# Patient Record
Sex: Female | Born: 1985
Health system: Southern US, Community
[De-identification: ages and names within clinical notes are randomized; demographics above are authoritative.]

---
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
## 2006-01-21 ENCOUNTER — Other Ambulatory Visit: Admission: RE | Admit: 2006-01-21 | Discharge: 2006-01-21 | Payer: Self-pay | Admitting: Family Medicine

## 2006-08-30 ENCOUNTER — Other Ambulatory Visit: Admission: RE | Admit: 2006-08-30 | Discharge: 2006-08-30 | Payer: Self-pay | Admitting: Obstetrics and Gynecology

## 2014-08-14 LAB — OB RESULTS CONSOLE ABO/RH: RH TYPE: POSITIVE

## 2014-08-14 LAB — OB RESULTS CONSOLE RUBELLA ANTIBODY, IGM: Rubella: IMMUNE

## 2014-08-14 LAB — OB RESULTS CONSOLE HIV ANTIBODY (ROUTINE TESTING): HIV: NONREACTIVE

## 2014-08-14 LAB — OB RESULTS CONSOLE GC/CHLAMYDIA
CHLAMYDIA, DNA PROBE: NEGATIVE
GC PROBE AMP, GENITAL: NEGATIVE

## 2014-08-14 LAB — OB RESULTS CONSOLE RPR: RPR: NONREACTIVE

## 2014-08-14 LAB — OB RESULTS CONSOLE GBS: GBS: NEGATIVE

## 2015-01-26 NOTE — L&D Delivery Note (Signed)
  Delivery Note I was called to attend this delivery d/t rapid progression. After one push, a viable female was born at 0026, apgars 9/9, weight pending.  After 3 minutes, the cord was clamped and cut. 10 units of pitocin was given IM. At 0029, the placenta separated spontaneously and delivered via CCT and maternal pushing effort.  It was inspected and appears to be intact with a 3 VC.    No anesthesia No tears EBL: 50cc   Mom to postpartum.  Baby to Couplet care / Skin to Skin.  Sandoval,Barbara Endres 02/28/2015, 12:42 AM

## 2015-02-27 ENCOUNTER — Inpatient Hospital Stay (HOSPITAL_COMMUNITY)
Admission: AD | Admit: 2015-02-27 | Discharge: 2015-03-01 | DRG: 775 | Disposition: A | Discharge status: Home / Self Care | Payer: BLUE CROSS/BLUE SHIELD | Source: Ambulatory Visit | Attending: Obstetrics and Gynecology | Admitting: Obstetrics and Gynecology

## 2015-02-27 DIAGNOSIS — Z3490 Encounter for supervision of normal pregnancy, unspecified, unspecified trimester: Secondary | ICD-10-CM

## 2015-02-27 DIAGNOSIS — Z3A39 39 weeks gestation of pregnancy: Secondary | ICD-10-CM

## 2015-02-27 NOTE — MAU Note (Signed)
PT  SAYS HURT BAD  AT 10PM.     VE IN OFFICE  1  CM,      DENIES HSV AND MRSA.    GBS-  NEG

## 2015-02-28 ENCOUNTER — Encounter (HOSPITAL_COMMUNITY): Payer: Self-pay | Admitting: *Deleted

## 2015-02-28 DIAGNOSIS — Z3A39 39 weeks gestation of pregnancy: Secondary | ICD-10-CM

## 2015-02-28 DIAGNOSIS — Z3490 Encounter for supervision of normal pregnancy, unspecified, unspecified trimester: Secondary | ICD-10-CM

## 2015-02-28 LAB — CBC
HCT: 35.2 % — ABNORMAL LOW (ref 36.0–46.0)
HEMATOCRIT: 37.2 % (ref 36.0–46.0)
HEMOGLOBIN: 11.9 g/dL — AB (ref 12.0–15.0)
Hemoglobin: 12.9 g/dL (ref 12.0–15.0)
MCH: 30.2 pg (ref 26.0–34.0)
MCH: 30.8 pg (ref 26.0–34.0)
MCHC: 33.8 g/dL (ref 30.0–36.0)
MCHC: 34.7 g/dL (ref 30.0–36.0)
MCV: 88.8 fL (ref 78.0–100.0)
MCV: 89.3 fL (ref 78.0–100.0)
PLATELETS: 276 10*3/uL (ref 150–400)
Platelets: 250 10*3/uL (ref 150–400)
RBC: 3.94 MIL/uL (ref 3.87–5.11)
RBC: 4.19 MIL/uL (ref 3.87–5.11)
RDW: 15 % (ref 11.5–15.5)
RDW: 15 % (ref 11.5–15.5)
WBC: 17.4 10*3/uL — ABNORMAL HIGH (ref 4.0–10.5)
WBC: 21.6 10*3/uL — ABNORMAL HIGH (ref 4.0–10.5)

## 2015-02-28 LAB — TYPE AND SCREEN
ABO/RH(D): O POS
Antibody Screen: NEGATIVE

## 2015-02-28 LAB — ABO/RH: ABO/RH(D): O POS

## 2015-02-28 LAB — RPR: RPR Ser Ql: NONREACTIVE

## 2015-02-28 MED ORDER — SIMETHICONE 80 MG PO CHEW: 80.0000 mg | CHEWABLE_TABLET | ORAL | Status: DC | PRN

## 2015-02-28 MED ORDER — LACTATED RINGERS IV SOLN: 500.0000 mL | INTRAVENOUS | Status: DC | PRN

## 2015-02-28 MED ORDER — OXYTOCIN 10 UNIT/ML IJ SOLN: 2.5000 [IU]/h | INTRAVENOUS | Status: DC

## 2015-02-28 MED ORDER — OXYTOCIN BOLUS FROM INFUSION: 500.0000 mL | INTRAVENOUS | Status: DC

## 2015-02-28 MED ORDER — LIDOCAINE HCL (PF) 1 % IJ SOLN
30.0000 mL | INTRAMUSCULAR | Status: DC | PRN
  Filled 2015-02-28: qty 30

## 2015-02-28 MED ORDER — ONDANSETRON HCL 4 MG/2ML IJ SOLN: 4.0000 mg | INTRAMUSCULAR | Status: DC | PRN

## 2015-02-28 MED ORDER — OXYTOCIN 10 UNIT/ML IJ SOLN
INTRAMUSCULAR | Status: AC
  Administered 2015-02-28: 10 [IU]
  Filled 2015-02-28: qty 1

## 2015-02-28 MED ORDER — ACETAMINOPHEN 325 MG PO TABS: 650.0000 mg | ORAL_TABLET | ORAL | Status: DC | PRN

## 2015-02-28 MED ORDER — TETANUS-DIPHTH-ACELL PERTUSSIS 5-2.5-18.5 LF-MCG/0.5 IM SUSP
0.5000 mL | Freq: Once | INTRAMUSCULAR | Status: AC
  Administered 2015-03-01: 0.5 mL via INTRAMUSCULAR
  Filled 2015-02-28: qty 0.5

## 2015-02-28 MED ORDER — DIBUCAINE 1 % RE OINT: 1.0000 "application " | TOPICAL_OINTMENT | RECTAL | Status: DC | PRN

## 2015-02-28 MED ORDER — WITCH HAZEL-GLYCERIN EX PADS: 1.0000 "application " | MEDICATED_PAD | CUTANEOUS | Status: DC | PRN

## 2015-02-28 MED ORDER — LANOLIN HYDROUS EX OINT: TOPICAL_OINTMENT | CUTANEOUS | Status: DC | PRN

## 2015-02-28 MED ORDER — LIDOCAINE HCL (PF) 1 % IJ SOLN
INTRAMUSCULAR | Status: AC
  Filled 2015-02-28: qty 30

## 2015-02-28 MED ORDER — OXYCODONE-ACETAMINOPHEN 5-325 MG PO TABS: 2.0000 | ORAL_TABLET | ORAL | Status: DC | PRN

## 2015-02-28 MED ORDER — CITRIC ACID-SODIUM CITRATE 334-500 MG/5ML PO SOLN: 30.0000 mL | ORAL | Status: DC | PRN

## 2015-02-28 MED ORDER — IBUPROFEN 600 MG PO TABS
600.0000 mg | ORAL_TABLET | Freq: Four times a day (QID) | ORAL | Status: DC
  Administered 2015-02-28 – 2015-03-01 (×6): 600 mg via ORAL
  Filled 2015-02-28 (×6): qty 1

## 2015-02-28 MED ORDER — OXYCODONE-ACETAMINOPHEN 5-325 MG PO TABS: 1.0000 | ORAL_TABLET | ORAL | Status: DC | PRN

## 2015-02-28 MED ORDER — ONDANSETRON HCL 4 MG/2ML IJ SOLN: 4.0000 mg | Freq: Four times a day (QID) | INTRAMUSCULAR | Status: DC | PRN

## 2015-02-28 MED ORDER — LACTATED RINGERS IV SOLN: INTRAVENOUS | Status: DC

## 2015-02-28 MED ORDER — BENZOCAINE-MENTHOL 20-0.5 % EX AERO
1.0000 "application " | INHALATION_SPRAY | CUTANEOUS | Status: DC | PRN
  Administered 2015-02-28: 1 via TOPICAL
  Filled 2015-02-28: qty 56

## 2015-02-28 MED ORDER — PRENATAL MULTIVITAMIN CH
1.0000 | ORAL_TABLET | Freq: Every day | ORAL | Status: DC
  Administered 2015-02-28 – 2015-03-01 (×2): 1 via ORAL
  Filled 2015-02-28 (×2): qty 1

## 2015-02-28 MED ORDER — DIPHENHYDRAMINE HCL 25 MG PO CAPS: 25.0000 mg | ORAL_CAPSULE | Freq: Four times a day (QID) | ORAL | Status: DC | PRN

## 2015-02-28 MED ORDER — ZOLPIDEM TARTRATE 5 MG PO TABS: 5.0000 mg | ORAL_TABLET | Freq: Every evening | ORAL | Status: DC | PRN

## 2015-02-28 MED ORDER — ONDANSETRON HCL 4 MG PO TABS: 4.0000 mg | ORAL_TABLET | ORAL | Status: DC | PRN

## 2015-02-28 MED ORDER — SENNOSIDES-DOCUSATE SODIUM 8.6-50 MG PO TABS
2.0000 | ORAL_TABLET | ORAL | Status: DC
  Administered 2015-03-01: 2 via ORAL
  Filled 2015-02-28: qty 2

## 2015-02-28 NOTE — H&P (Signed)
APRILLE SAWHNEY is a 30 y.o. female presenting for UCs. Maternal Medical History:  Reason for admission: Contractions.     OB History    Gravida Para Term Preterm AB TAB SAB Ectopic Multiple Living   History reviewed. No pertinent past medical history. History reviewed. No pertinent past surgical history. Family History: family history is not on file. Social History:  has no tobacco, alcohol, and drug history on file.   Prenatal Transfer Tool  Maternal Diabetes: No Genetic Screening: Normal Maternal Ultrasounds/Referrals: Normal Fetal Ultrasounds or other Referrals:  None Maternal Substance Abuse:  No Significant Maternal Medications:  None Significant Maternal Lab Results:  None Other Comments:  None  ROS  Dilation: 4 Effacement (%): 70 Station: 0 Exam by:: BRIGETTE,  RN Height  (1.651 m), weight 199 lb 6.4 oz (90.447 kg). Exam Physical Exam  Cardiovascular: Normal rate.   Respiratory: Effort normal.  GI: Soft.    Prenatal labs: ABO, Rh:   Antibody:   Rubella:   RPR:    HBsAg:    HIV:    GBS:     Assessment/Plan: 30 yo G2P1 @ 39 5/7 weeks in active labor   Kendrea Cerritos II,Shepherd Finnan E 02/28/2015, 12:43 AM

## 2015-02-28 NOTE — Progress Notes (Signed)
I was called with a report that Cx = 7cm and patient pushing involuntarily. I immediately came to hospital. Upon arrival baby/placenta delivered and patient stable. States patient progressed from 7 cm to delivery in one contraction.

## 2015-02-28 NOTE — Lactation Note (Signed)
This note was copied from the chart of Barbara Saraia Platner. Lactation Consultation Note  Initial visit made.  Breastfeeding consultation services and support information given and reviewed with patient.  Mom states baby has been nursing well since birth.  Observed mom latch baby to breast easily.  Baby nursed actively with good swallows. Instructed to feed with any cue.  Encouraged to call with concerns/assist.  Patient Name: Barbara Sandoval Today's Date: 02/28/2015 Reason for consult: Initial assessment   Maternal Data Formula Feeding for Exclusion: No Does the patient have breastfeeding experience prior to this delivery?: Yes  Feeding Feeding Type: Breast Fed  LATCH Score/Interventions Latch: Grasps breast easily, tongue down, lips flanged, rhythmical sucking.  Audible Swallowing: A few with stimulation Intervention(s): Skin to skin;Hand expression;Alternate breast massage  Type of Nipple: Everted at rest and after stimulation  Comfort (Breast/Nipple): Soft / non-tender     Hold (Positioning): No assistance needed to correctly position infant at breast.  LATCH Score: 9  Lactation Tools Discussed/Used     Consult Status Consult Status: Follow-up Date: 03/01/15 Follow-up type: In-patient    Barbara Sandoval 02/28/2015, 2:11 PM

## 2015-02-28 NOTE — MAU Note (Signed)
Pt presented to MAU with painful uc's.

## 2015-02-28 NOTE — Progress Notes (Signed)
Post Partum Day 0 Subjective: no complaints, up ad lib, voiding and tolerating PO  Objective: Blood pressure 110/68, pulse 76, temperature 98.1 F (36.7 C), temperature source Oral, resp. rate 20, height  (1.651 m), weight 199 lb 6.4 oz (90.447 kg), unknown if currently breastfeeding.  Physical Exam:  General: alert and cooperative Lochia: appropriate Uterine Fundus: firm Incision: healing well DVT Evaluation: No evidence of DVT seen on physical exam. Negative Homan's sign. No cords or calf tenderness. No significant calf/ankle edema.   Recent Labs  02/28/15 0020 02/28/15 0605  HGB 12.9 11.9*  HCT 37.2 35.2*    Assessment/Plan: Plan for discharge tomorrow   LOS: 0 days   Jonna Dittrich G 02/28/2015, 8:37 AM

## 2015-03-01 NOTE — Progress Notes (Signed)
CLINICAL SOCIAL WORK MATERNAL/CHILD NOTE  Patient Details  Name: Barbara Sandoval MRN: [id number] Date of Birth: 02/28/2014  Date: 03/01/2015  Clinical Social Worker Initiating Note: Tammie Ellsworth, LCSWDate/ Time Initiated: 03/01/15/1045   Child's Name: Barbara Sandoval   Legal Guardian:  (Parents )   Need for Interpreter: None   Date of Referral: 02/28/15   Reason for Referral:     Referral Source: [facility]   Address: [address] [place], [place] [address]  Phone number:  [phone])   Household Members: Minor Children, Spouse   Natural Supports (not living in the home): Extended Family, Immediate Family   Professional Supports:None   Employment: (Both parents employed)   Type of Work:     Education:     Printmaker   Other Resources:     Cultural/Religious Considerations Which May Impact Care: none noted  Strengths: Ability to meet basic needs , Home prepared for child , Pediatrician chosen    Risk Factors/Current Problems:     Cognitive State: Able to Concentrate , Alert    Mood/Affect: Happy , Interested    CSW Assessment: Acknowledged order for social work consult to assess mother's hx of Depression and anxiety. Met with mother who was pleasant and receptive to social work. She is married with one other dependent age 35. Spouse was present and very attentive. MOB states that her Physician initially thought that she was exhibiting symptoms of depression and anxiety, but later diagnosed her with ADHD. Informed that prior to the pregnancy, she was started on medication for ADHD and it was very effective in managing her symptoms. "I believe the ADHD was causing the anxiety". She reported no symptoms of anxiety once she was stated to medication for the ADHD. MOB also stated that she stop taking the medication once she became pregnant and states there was no acute changes in  her mood or behavior since she has been off the medication. She reports no immediate plans to resume the medication unless it is necessary. No acute social concerns noted or reported at this time. Affect and behavior was appropriate during the entire visit. Mother informed of social work Fish farm manager.  CSW Plan/Description:    Discussed signs/symptoms of PP Depression and available resources No further intervention required No barriers to discharge   Kendrah Lovern J, LCSW 03/01/2015, 3:55 PM

## 2015-03-01 NOTE — Discharge Summary (Signed)
Obstetric Discharge Summary Reason for Admission: onset of labor Prenatal Procedures: none Intrapartum Procedures: spontaneous vaginal delivery Postpartum Procedures: none Complications-Operative and Postpartum: none HEMOGLOBIN  Date Value Ref Range Status  02/28/2015 11.9* 12.0 - 15.0 g/dL Final   HCT  Date Value Ref Range Status  02/28/2015 35.2* 36.0 - 46.0 % Final    Physical Exam:  General: alert Lochia: appropriate Uterine Fundus: firm Incision: healing well DVT Evaluation: No evidence of DVT seen on physical exam.  Discharge Diagnoses: Term Pregnancy-delivered  Discharge Information: Date: 03/01/2015 Activity: pelvic rest Diet: routine Medications: PNV Condition: stable Instructions: refer to practice specific booklet Discharge to: home Follow-up Information    Follow up with Meriel Pica, MD. Schedule an appointment as soon as possible for a visit in 6 weeks.   Specialty:  Obstetrics and Gynecology   Contact information:   [address] [place] [address] [phone]       Newborn Data: Live born female  Birth Weight: 6 lb 15.8 oz (3170 g) APGAR: 8, 9  Home with mother.  Meriel Pica 03/01/2015, 9:53 AM

## 2015-03-01 NOTE — Lactation Note (Signed)
This note was copied from the chart of Barbara Sandoval. Lactation Consultation Note  Patient Name: Barbara Sandoval Date: 03/01/2015 Reason for consult: Follow-up assessment Mom reports some mild discomfort with initial latch that improves with baby nursing. No breakdown noted today. Advised to apply EBM, Mom has comfort gels. Offered to assist with latch at next feeding, Mom will advise. Engorgement care reviewed if needed. Advised of OP services and support group. Mom denies other questions/concerns.   Maternal Data    Feeding Feeding Type: Breast Fed Length of feed: 20 min  LATCH Score/Interventions Latch: Grasps breast easily, tongue down, lips flanged, rhythmical sucking.  Audible Swallowing: A few with stimulation  Type of Nipple: Everted at rest and after stimulation  Comfort (Breast/Nipple): Filling, red/small blisters or bruises, mild/mod discomfort     Hold (Positioning): No assistance needed to correctly position infant at breast.  LATCH Score: 8  Lactation Tools Discussed/Used Tools: Comfort gels   Consult Status Consult Status: Complete Date: 03/01/15 Follow-up type: In-patient    Alfred Levins 03/01/2015, 10:47 AM

## 2015-03-03 ENCOUNTER — Inpatient Hospital Stay (HOSPITAL_COMMUNITY): Admission: RE | Admit: 2015-03-03 | Payer: BLUE CROSS/BLUE SHIELD | Source: Ambulatory Visit

## 2015-04-11 ENCOUNTER — Ambulatory Visit (HOSPITAL_COMMUNITY)
Admission: RE | Admit: 2015-04-11 | Discharge: 2015-04-11 | Disposition: A | Discharge status: Home / Self Care | Payer: BLUE CROSS/BLUE SHIELD | Source: Ambulatory Visit | Attending: Obstetrics and Gynecology | Admitting: Obstetrics and Gynecology

## 2015-04-11 NOTE — Lactation Note (Signed)
Lactation Consult; Weight today 10 -5.7 oz 4698 g Dr. Tretha SciaraMcMurtry's information given to mom for her consideration.  Mom has a friend whose baby had a tongue tie so she is familiar with this. Encouraged to pump at least 4-6 times /day to protect milk supply. Encouraged to call and make another appointment if has tongue revision performed. No further quetitons at present To call prn  Mother's reason for visit:  Help with breast feeding- trouble with latch and with bottle feeding, asking about tongue tie Visit Type:  Feeding assessment Appointment Notes:  [address] old Consult:  Initial Lactation Consultant:  Pamelia HoitWeeks, Jeroline Wolbert D  ________________________________________________________________________ Baby's Name: Elio ForgetVivie Louise Brim Date of Birth: 02/28/2014 [place] [facility] [place]: female Gestational Age: 2227w5d (At Birth) Birth Weight: 6 lb 15.8 oz (3170 g) Weight at Discharge: Weight: 6 lb 10.4 oz (3015 g)[facility]   02/28[facility]15 0026 03/01/15 0012  Weight: 6 lb 15.8 oz (3170 g) 6 lb 10.4 oz (3015 g)        ________________________________________________________________________  Mother's Name: Collier FlowersLinsey P Polyak Breastfeeding Experience:  P2 Experienced BF for 6 months- reports this baby nurses completely different that her first ________________________________________________________________________  Breastfeeding History (Post Discharge)  Frequency of breastfeeding:  All the time- it is like she is never satisfied Duration of feeding:  5- 10 min- comes off the breast fussing  Supplementation  Formula:  Volume  2 oz per day        Brand: Alimentum  Breastmilk:  Volume as available   Method:  Bottle, mom reports they have tried every kind of nipple- mam working the best at present.   Pumping  Type of pump:  Ameda- mom reports pumping hurts- suggested getting larger flanges Frequency:  occas Volume:  1-2 oz  Infant Intake  and Output Assessment  Voids:  6+ in 24 hrs.  Color:  Clear yellow Stools:  0 in 24 hrs.   ________________________________________________________________________  Maternal Breast Assessment  Breast:  Soft Nipple:  Erect  _______________________________________________________________________ Feeding Assessment/Evaluation  Initial feeding assessment:  Infant's oral assessment:  Variance- Vivie's tongue does not extend beyond her gumline and has limited mobility.  Upper lip tie noted- mom reports having trouble getting her upper lip to flange out.  Positioning:  Cradle Right breast   Suck assessment:  Nutritive and Nonnutritive  Vivie nursed on right breast for 15 min. Mom reports she does not usually do this long- gets fussy- on and off the breast.. Lots of smacking noted Mom reports this happens with the bottle too- like she can't get a good seal on it.  Pre-feed weight:  4698 g 10-5.7 oz Post-feed weight:  4736 g  10- 7.1   Amount transferred:  38 ml   Vivie latched and nursed on left breast for 15 min. Going off to sleep.Few swallows noted Encouraged mom to start pumping 4-6 times/day to protect milk supply  Pre-feed weight:  4736 g  10-7.1 oz Post-feed weight:  4774 g  10- 8.4 oz Amount transferred:  38 ml  Total amount pumped post feed:  Did not bring pump with her  Total amount transferred:    76 ml

## 2015-04-18 ENCOUNTER — Ambulatory Visit (HOSPITAL_COMMUNITY)
Admission: RE | Admit: 2015-04-18 | Discharge: 2015-04-18 | Disposition: A | Discharge status: Home / Self Care | Payer: BLUE CROSS/BLUE SHIELD | Source: Ambulatory Visit | Attending: Obstetrics and Gynecology | Admitting: Obstetrics and Gynecology

## 2015-04-18 NOTE — Lactation Note (Signed)
Lactation Consult  Mother's reason for visit:  Post frenectomy Visit Type:  OP Appointment Notes:  Wound sites have visible diamonds.  Barbara is more uncomfortable today, day 3 which is expected because she is using different muscles for feeding.  Mom reports that baby is latching better, Assisted mom with a deeper latch.  Snapback is still heard but suspect it will decrease as baby's muscles become stronger. Mom was shown how to do tummy time and neck ROM as well as jaw massage and sucking exercises.  Total transfer today was 76 ml. Mom also reports that Barbara now takes a bottle and also that she has less gas.  Encouraged warm bath and/or soaks to lower jaw for as a comfort measure. Follow-up next week. Consult:  Follow-Up Lactation Consultant:  Barbara DryerJoseph, Barbara Sandoval  ________________________________________________________________________ Barbara FloresBaby's Name: Barbara ForgetVivie Louise Sandoval Date of Birth: 02/28/2014 [place] [facility] [place] Gender: female Gestational Age: 631w5d (At Birth) Birth Weight: 6 lb 15.8 oz (3170 g) Weight at Discharge: Weight: 6 lb 10.4 oz (3015 g)Date [facility]: [facility]   02/28/15 0026 03/01/15 0012  Weight: 6 lb 15.8 oz (3170 g) 6 lb 10.4 oz (3015 g)   Weight today: 10+11.4  4860  Post feed 4936       ________________________________________________________________________  Mother's Name: Collier FlowersLinsey P Sandoval ________________________________________________________________________

## 2015-04-25 ENCOUNTER — Ambulatory Visit (HOSPITAL_COMMUNITY): Admission: RE | Admit: 2015-04-25 | Payer: BLUE CROSS/BLUE SHIELD | Source: Ambulatory Visit

## 2015-11-21 ENCOUNTER — Other Ambulatory Visit: Payer: Self-pay | Admitting: Family Medicine

## 2015-11-21 ENCOUNTER — Ambulatory Visit
Admission: RE | Admit: 2015-11-21 | Discharge: 2015-11-21 | Disposition: A | Discharge status: Home / Self Care | Payer: BLUE CROSS/BLUE SHIELD | Source: Ambulatory Visit | Attending: Family Medicine | Admitting: Family Medicine

## 2015-11-21 DIAGNOSIS — M7501 Adhesive capsulitis of right shoulder: Secondary | ICD-10-CM

## 2017-07-20 DIAGNOSIS — Z01419 Encounter for gynecological examination (general) (routine) without abnormal findings: Secondary | ICD-10-CM | POA: Diagnosis not present

## 2017-11-10 DIAGNOSIS — H66002 Acute suppurative otitis media without spontaneous rupture of ear drum, left ear: Secondary | ICD-10-CM | POA: Diagnosis not present

## 2017-11-10 DIAGNOSIS — J029 Acute pharyngitis, unspecified: Secondary | ICD-10-CM | POA: Diagnosis not present

## 2018-01-09 DIAGNOSIS — J019 Acute sinusitis, unspecified: Secondary | ICD-10-CM | POA: Diagnosis not present

## 2018-01-09 DIAGNOSIS — J029 Acute pharyngitis, unspecified: Secondary | ICD-10-CM | POA: Diagnosis not present

## 2018-01-20 IMAGING — CR DG SHOULDER 2+V*R*
3 series · 3 of 3 positions shown · non-contrast
Comparison: None.

CLINICAL DATA: Adhesive capsulitis of right shoulder. Anterior
right shoulder pain, limited range of motion.

EXAM:
RIGHT SHOULDER - 2+ VIEW

[w shoulder ap internal righ]
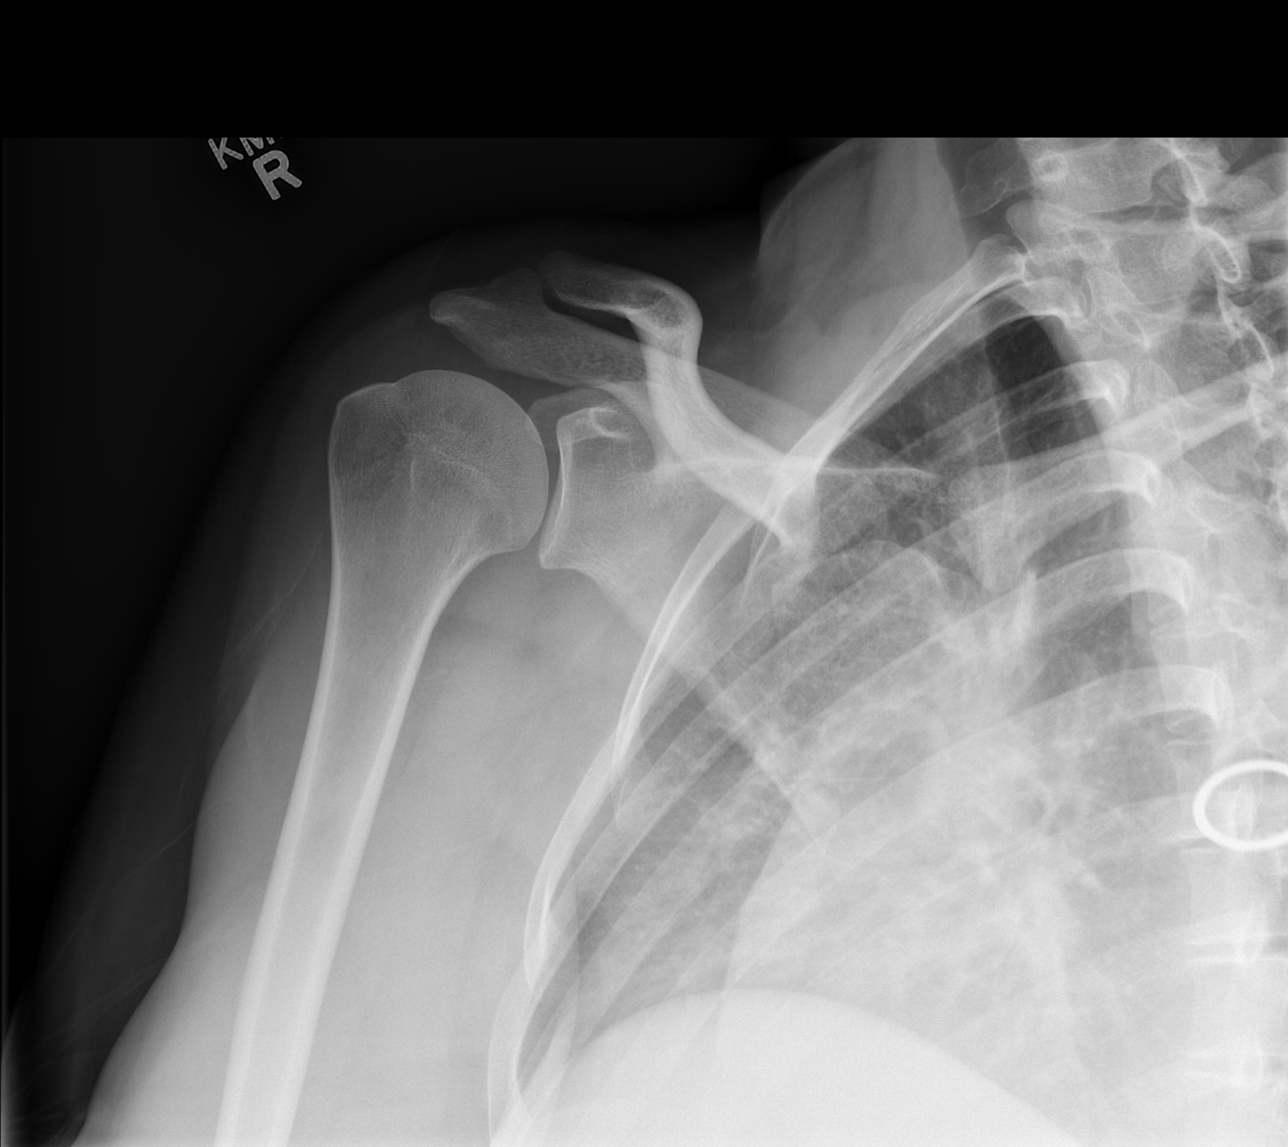

[w shoulder y view right]
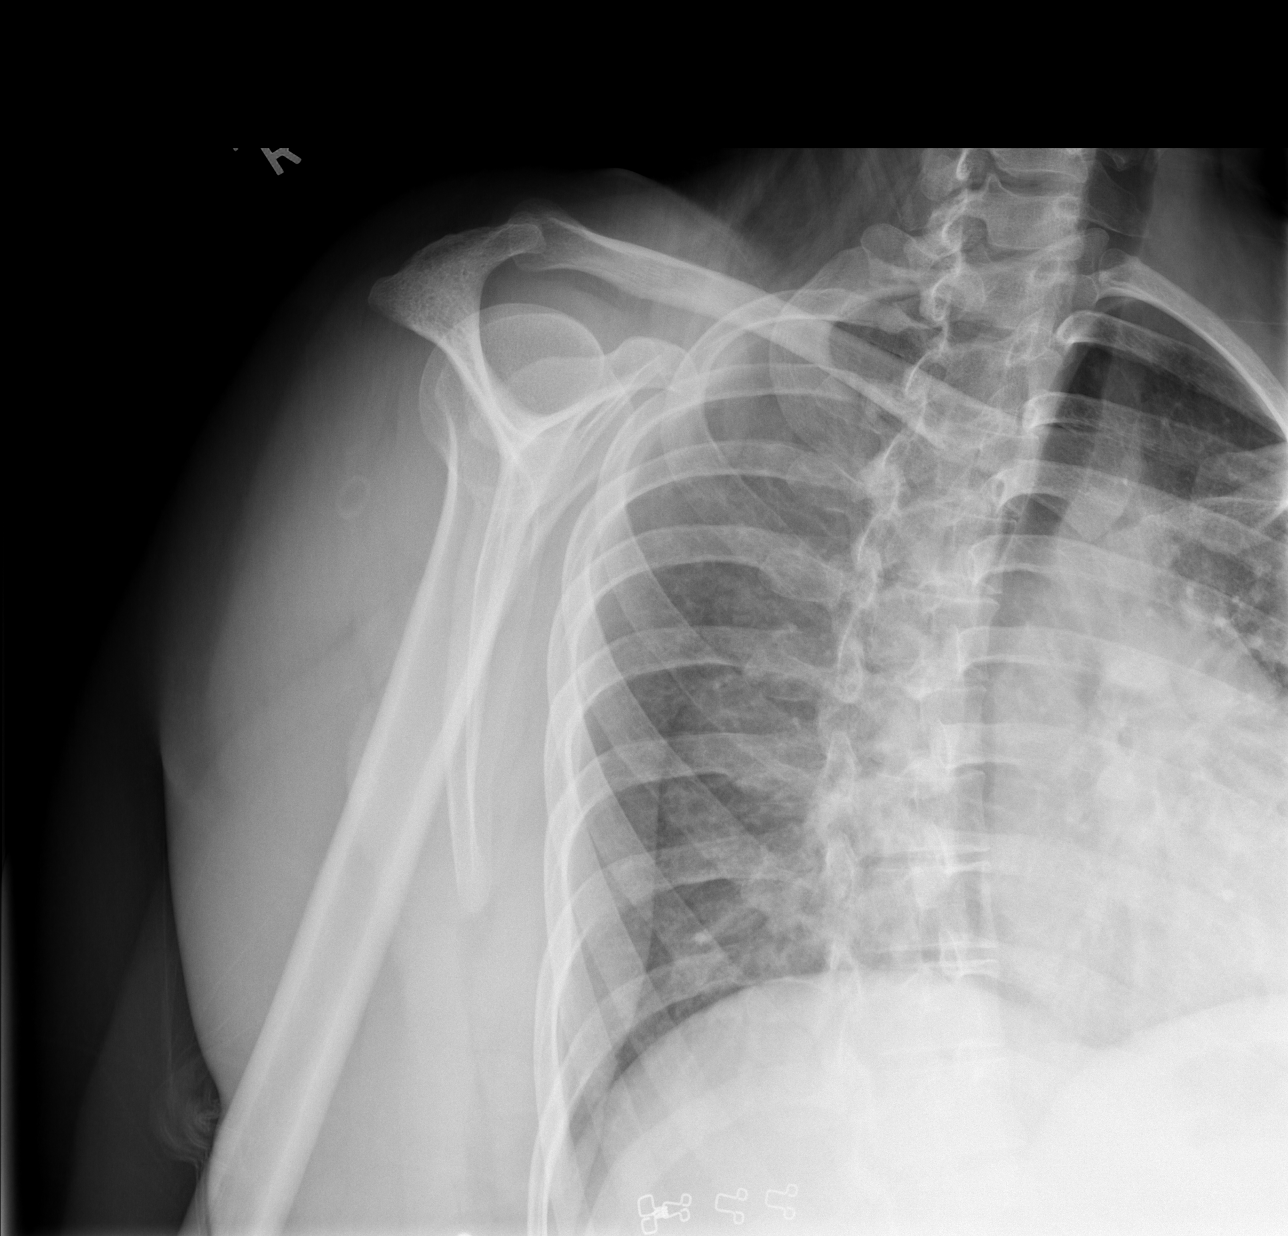

[x shoulder axillary right]
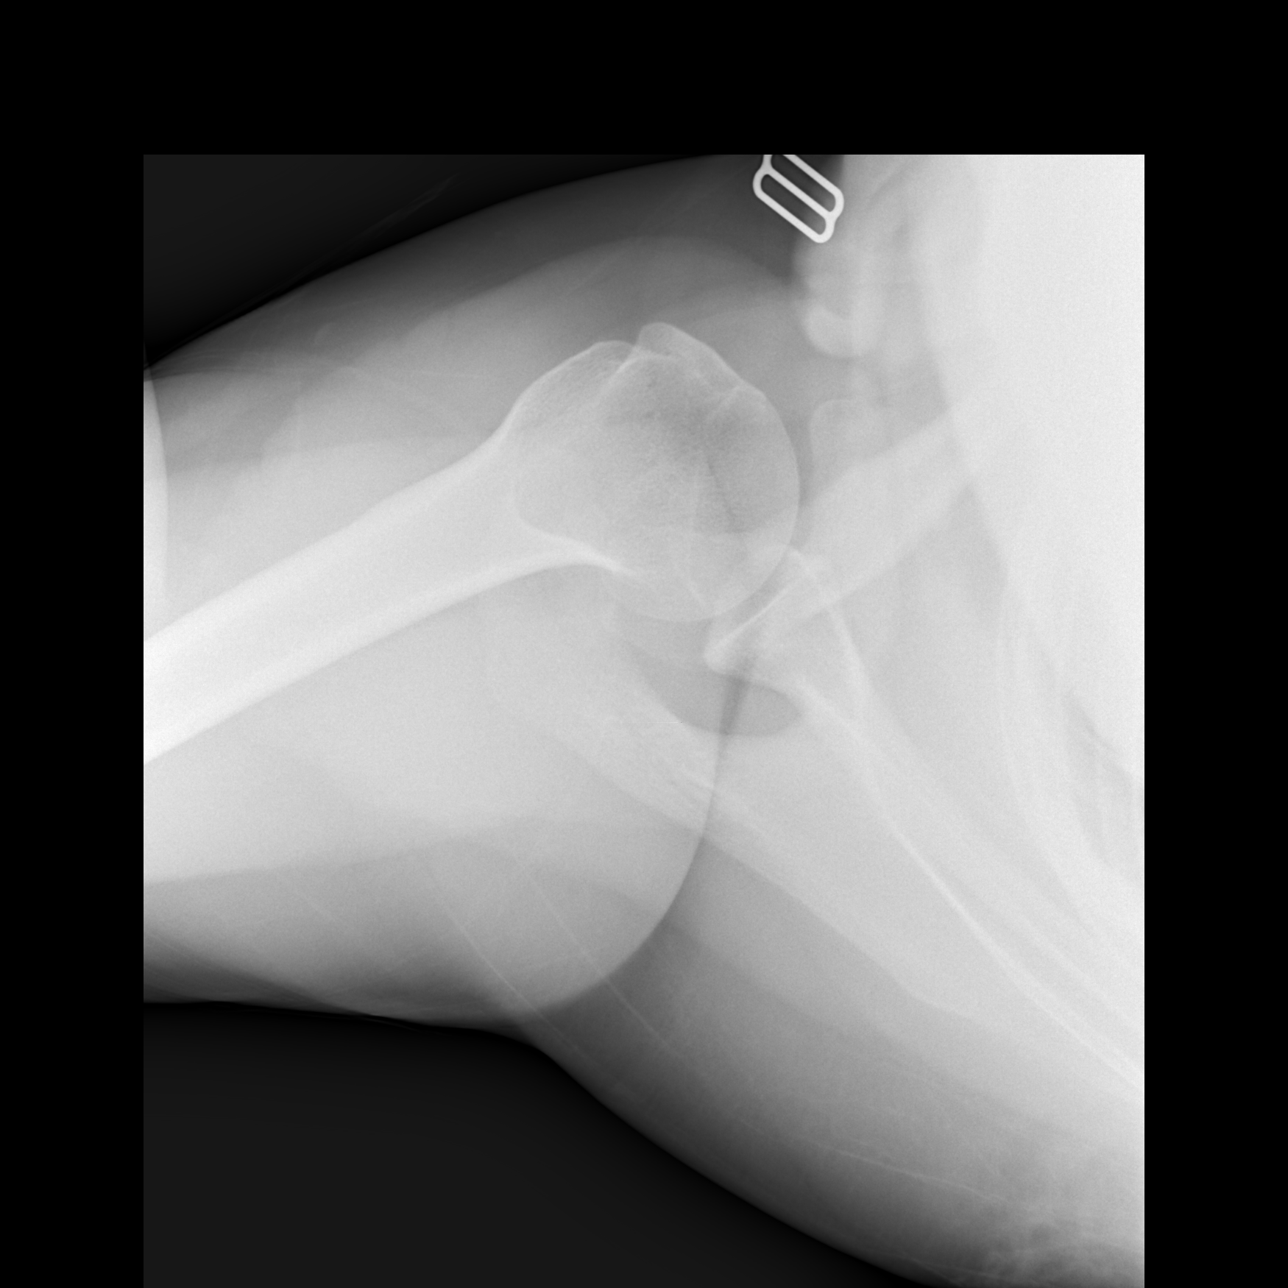

[3 of 3 positions shown; findings below may reference images not displayed]

FINDINGS: There is no evidence of fracture or dislocation. There is no
evidence of arthropathy or other focal bone abnormality. Soft
tissues are unremarkable.
IMPRESSION: Negative.

## 2018-11-27 DIAGNOSIS — Z6832 Body mass index (BMI) 32.0-32.9, adult: Secondary | ICD-10-CM | POA: Diagnosis not present

## 2018-11-27 DIAGNOSIS — Z309 Encounter for contraceptive management, unspecified: Secondary | ICD-10-CM | POA: Diagnosis not present

## 2018-11-27 DIAGNOSIS — Z01419 Encounter for gynecological examination (general) (routine) without abnormal findings: Secondary | ICD-10-CM | POA: Diagnosis not present

## 2019-05-21 DIAGNOSIS — Z113 Encounter for screening for infections with a predominantly sexual mode of transmission: Secondary | ICD-10-CM | POA: Diagnosis not present

## 2019-05-21 DIAGNOSIS — R309 Painful micturition, unspecified: Secondary | ICD-10-CM | POA: Diagnosis not present

## 2019-05-21 DIAGNOSIS — N9089 Other specified noninflammatory disorders of vulva and perineum: Secondary | ICD-10-CM | POA: Diagnosis not present

## 2019-05-21 DIAGNOSIS — N760 Acute vaginitis: Secondary | ICD-10-CM | POA: Diagnosis not present

## 2019-08-26 DIAGNOSIS — J0100 Acute maxillary sinusitis, unspecified: Secondary | ICD-10-CM | POA: Diagnosis not present

## 2019-08-26 DIAGNOSIS — J029 Acute pharyngitis, unspecified: Secondary | ICD-10-CM | POA: Diagnosis not present

## 2019-08-26 DIAGNOSIS — Z792 Long term (current) use of antibiotics: Secondary | ICD-10-CM | POA: Diagnosis not present

## 2019-10-09 ENCOUNTER — Telehealth: Payer: Self-pay | Admitting: Physician Assistant

## 2019-10-09 NOTE — Telephone Encounter (Signed)
  Called to discuss with patient about Covid symptoms and the use of  casirivimab/imdevimab, a monoclonal antibody infusion for those with mild to moderate Covid symptoms and at a high risk of hospitalization.    Message left to call back.  Swayzie Choate, PA - C 

## 2019-10-11 ENCOUNTER — Other Ambulatory Visit: Payer: Self-pay | Admitting: Oncology

## 2019-10-11 DIAGNOSIS — U071 COVID-19: Secondary | ICD-10-CM

## 2019-10-11 NOTE — Progress Notes (Signed)
I connected by phone with  Barbara Sandoval to discuss the potential use of an new treatment for mild to moderate COVID-19 viral infection in non-hospitalized patients.   This patient is a age/sex that meets the FDA criteria for Emergency Use Authorization of casirivimab\imdevimab.  Has a (+) direct SARS-CoV-2 viral test result 1. Has mild or moderate COVID-19  2. Is ? 33 years of age and weighs ? 40 kg 3. Is NOT hospitalized due to COVID-19 4. Is NOT requiring oxygen therapy or requiring an increase in baseline oxygen flow rate due to COVID-19 5. Is within 10 days of symptom onset 6. Has at least one of the high risk factor(s) for progression to severe COVID-19 and/or hospitalization as defined in EUA. ? Specific high risk criteria :No past medical history on file. ? HIgh risk- BMI > 25   Symptom onset  10/06/2019   I have spoken and communicated the following to the patient or parent/caregiver:   1. FDA has authorized the emergency use of casirivimab\imdevimab for the treatment of mild to moderate COVID-19 in adults and pediatric patients with positive results of direct SARS-CoV-2 viral testing who are 50 years of age and older weighing at least 40 kg, and who are at high risk for progressing to severe COVID-19 and/or hospitalization.   2. The significant known and potential risks and benefits of casirivimab\imdevimab, and the extent to which such potential risks and benefits are unknown.   3. Information on available alternative treatments and the risks and benefits of those alternatives, including clinical trials.   4. Patients treated with casirivimab\imdevimab should continue to self-isolate and use infection control measures (e.g., wear mask, isolate, social distance, avoid sharing personal items, clean and disinfect "high touch" surfaces, and frequent handwashing) according to CDC guidelines.    5. The patient or parent/caregiver has the option to accept or refuse casirivimab\imdevimab  .   After reviewing this information with the patient, The patient agreed to proceed with receiving casirivimab\imdevimab infusion and will be provided a copy of the Fact sheet prior to receiving the infusion.Mignon Pine, AGNP-C [phone] (Infusion Center Hotline)

## 2019-10-12 ENCOUNTER — Ambulatory Visit (HOSPITAL_COMMUNITY)
Admission: RE | Admit: 2019-10-12 | Discharge: 2019-10-12 | Disposition: A | Discharge status: Home / Self Care | Payer: BC Managed Care – PPO | Source: Ambulatory Visit | Attending: Pulmonary Disease | Admitting: Pulmonary Disease

## 2019-10-12 ENCOUNTER — Other Ambulatory Visit (HOSPITAL_COMMUNITY): Payer: Self-pay

## 2019-10-12 DIAGNOSIS — U071 COVID-19: Secondary | ICD-10-CM | POA: Diagnosis not present

## 2019-10-12 MED ORDER — DIPHENHYDRAMINE HCL 50 MG/ML IJ SOLN: 50.0000 mg | Freq: Once | INTRAMUSCULAR | Status: DC | PRN

## 2019-10-12 MED ORDER — ALBUTEROL SULFATE HFA 108 (90 BASE) MCG/ACT IN AERS: 2.0000 | INHALATION_SPRAY | Freq: Once | RESPIRATORY_TRACT | Status: DC | PRN

## 2019-10-12 MED ORDER — SODIUM CHLORIDE 0.9 % IV SOLN: INTRAVENOUS | Status: DC | PRN

## 2019-10-12 MED ORDER — SODIUM CHLORIDE 0.9 % IV SOLN
1200.0000 mg | Freq: Once | INTRAVENOUS | Status: AC
  Administered 2019-10-12: 1200 mg via INTRAVENOUS

## 2019-10-12 MED ORDER — EPINEPHRINE 0.3 MG/0.3ML IJ SOAJ: 0.3000 mg | Freq: Once | INTRAMUSCULAR | Status: DC | PRN

## 2019-10-12 MED ORDER — METHYLPREDNISOLONE SODIUM SUCC 125 MG IJ SOLR: 125.0000 mg | Freq: Once | INTRAMUSCULAR | Status: DC | PRN

## 2019-10-12 MED ORDER — FAMOTIDINE IN NACL 20-0.9 MG/50ML-% IV SOLN: 20.0000 mg | Freq: Once | INTRAVENOUS | Status: DC | PRN

## 2019-10-12 NOTE — Discharge Instructions (Signed)
COVID-19 COVID-19 is a respiratory infection that is caused by a virus called severe acute respiratory syndrome coronavirus 2 (SARS-CoV-2). The disease is also known as coronavirus disease or novel coronavirus. In some people, the virus may not cause any symptoms. In others, it may cause a serious infection. The infection can get worse quickly and can lead to complications, such as:  Pneumonia, or infection of the lungs.  Acute respiratory distress syndrome or ARDS. This is a condition in which fluid build-up in the lungs prevents the lungs from filling with air and passing oxygen into the blood.  Acute respiratory failure. This is a condition in which there is not enough oxygen passing from the lungs to the body or when carbon dioxide is not passing from the lungs out of the body.  Sepsis or septic shock. This is a serious bodily reaction to an infection.  Blood clotting problems.  Secondary infections due to bacteria or fungus.  Organ failure. This is when your body's organs stop working. The virus that causes COVID-19 is contagious. This means that it can spread from person to person through droplets from coughs and sneezes (respiratory secretions). What are the causes? This illness is caused by a virus. You may catch the virus by:  Breathing in droplets from an infected person. Droplets can be spread by a person breathing, speaking, singing, coughing, or sneezing.  Touching something, like a table or a doorknob, that was exposed to the virus (contaminated) and then touching your mouth, nose, or eyes. What increases the risk? Risk for infection You are more likely to be infected with this virus if you:  Are within 6 feet (2 meters) of a person with COVID-19.  Provide care for or live with a person who is infected with COVID-19.  Spend time in crowded indoor spaces or live in shared housing. Risk for serious illness You are more likely to become seriously ill from the virus if  you:  Are 50 years of age or older. The higher your age, the more you are at risk for serious illness.  Live in a nursing home or long-term care facility.  Have cancer.  Have a long-term (chronic) disease such as: ? Chronic lung disease, including chronic obstructive pulmonary disease or asthma. ? A long-term disease that lowers your body's ability to fight infection (immunocompromised). ? Heart disease, including heart failure, a condition in which the arteries that lead to the heart become narrow or blocked (coronary artery disease), a disease which makes the heart muscle thick, weak, or stiff (cardiomyopathy). ? Diabetes. ? Chronic kidney disease. ? Sickle cell disease, a condition in which red blood cells have an abnormal "sickle" shape. ? Liver disease.  Are obese. What are the signs or symptoms? Symptoms of this condition can range from mild to severe. Symptoms may appear any time from 2 to 14 days after being exposed to the virus. They include:  A fever or chills.  A cough.  Difficulty breathing.  Headaches, body aches, or muscle aches.  Runny or stuffy (congested) nose.  A sore throat.  New loss of taste or smell. Some people may also have stomach problems, such as nausea, vomiting, or diarrhea. Other people may not have any symptoms of COVID-19. How is this diagnosed? This condition may be diagnosed based on:  Your signs and symptoms, especially if: ? You live in an area with a COVID-19 outbreak. ? You recently traveled to or from an area where the virus is common. ? You   provide care for or live with a person who was diagnosed with COVID-19. ? You were exposed to a person who was diagnosed with COVID-19.  A physical exam.  Lab tests, which may include: ? Taking a sample of fluid from the back of your nose and throat (nasopharyngeal fluid), your nose, or your throat using a swab. ? A sample of mucus from your lungs (sputum). ? Blood tests.  Imaging tests,  which may include, X-rays, CT scan, or ultrasound. How is this treated? At present, there is no medicine to treat COVID-19. Medicines that treat other diseases are being used on a trial basis to see if they are effective against COVID-19. Your health care provider will talk with you about ways to treat your symptoms. For most people, the infection is mild and can be managed at home with rest, fluids, and over-the-counter medicines. Treatment for a serious infection usually takes places in a hospital intensive care unit (ICU). It may include one or more of the following treatments. These treatments are given until your symptoms improve.  Receiving fluids and medicines through an IV.  Supplemental oxygen. Extra oxygen is given through a tube in the nose, a face mask, or a hood.  Positioning you to lie on your stomach (prone position). This makes it easier for oxygen to get into the lungs.  Continuous positive airway pressure (CPAP) or bi-level positive airway pressure (BPAP) machine. This treatment uses mild air pressure to keep the airways open. A tube that is connected to a motor delivers oxygen to the body.  Ventilator. This treatment moves air into and out of the lungs by using a tube that is placed in your windpipe.  Tracheostomy. This is a procedure to create a hole in the neck so that a breathing tube can be inserted.  Extracorporeal membrane oxygenation (ECMO). This procedure gives the lungs a chance to recover by taking over the functions of the heart and lungs. It supplies oxygen to the body and removes carbon dioxide. Follow these instructions at home: Lifestyle  If you are sick, stay home except to get medical care. Your health care provider will tell you how long to stay home. Call your health care provider before you go for medical care.  Rest at home as told by your health care provider.  Do not use any products that contain nicotine or tobacco, such as cigarettes,  e-cigarettes, and chewing tobacco. If you need help quitting, ask your health care provider.  Return to your normal activities as told by your health care provider. Ask your health care provider what activities are safe for you. General instructions  Take over-the-counter and prescription medicines only as told by your health care provider.  Drink enough fluid to keep your urine pale yellow.  Keep all follow-up visits as told by your health care provider. This is important. How is this prevented?  There is no vaccine to help prevent COVID-19 infection. However, there are steps you can take to protect yourself and others from this virus. To protect yourself:   Do not travel to areas where COVID-19 is a risk. The areas where COVID-19 is reported change often. To identify high-risk areas and travel restrictions, check the CDC travel website: wwwnc.cdc.gov/travel/notices  If you live in, or must travel to, an area where COVID-19 is a risk, take precautions to avoid infection. ? Stay away from people who are sick. ? Wash your hands often with soap and water for 20 seconds. If soap and water   are not available, use an alcohol-based hand sanitizer. ? Avoid touching your mouth, face, eyes, or nose. ? Avoid going out in public, follow guidance from your state and local health authorities. ? If you must go out in public, wear a cloth face covering or face mask. Make sure your mask covers your nose and mouth. ? Avoid crowded indoor spaces. Stay at least 6 feet (2 meters) away from others. ? Disinfect objects and surfaces that are frequently touched every day. This may include:  Counters and tables.  Doorknobs and light switches.  Sinks and faucets.  Electronics, such as phones, remote controls, keyboards, computers, and tablets. To protect others: If you have symptoms of COVID-19, take steps to prevent the virus from spreading to others.  If you think you have a COVID-19 infection, contact  your health care provider right away. Tell your health care team that you think you may have a COVID-19 infection.  Stay home. Leave your house only to seek medical care. Do not use public transport.  Do not travel while you are sick.  Wash your hands often with soap and water for 20 seconds. If soap and water are not available, use alcohol-based hand sanitizer.  Stay away from other members of your household. Let healthy household members care for children and pets, if possible. If you have to care for children or pets, wash your hands often and wear a mask. If possible, stay in your own room, separate from others. Use a different bathroom.  Make sure that all people in your household wash their hands well and often.  Cough or sneeze into a tissue or your sleeve or elbow. Do not cough or sneeze into your hand or into the air.  Wear a cloth face covering or face mask. Make sure your mask covers your nose and mouth. Where to find more information  Centers for Disease Control and Prevention: www.cdc.gov/coronavirus/2019-ncov/index.html  World Health Organization: www.who.int/health-topics/coronavirus Contact a health care provider if:  You live in or have traveled to an area where COVID-19 is a risk and you have symptoms of the infection.  You have had contact with someone who has COVID-19 and you have symptoms of the infection. Get help right away if:  You have trouble breathing.  You have pain or pressure in your chest.  You have confusion.  You have bluish lips and fingernails.  You have difficulty waking from sleep.  You have symptoms that get worse. These symptoms may represent a serious problem that is an emergency. Do not wait to see if the symptoms will go away. Get medical help right away. Call your local emergency services (911 in the [place] Do not drive yourself to the hospital. Let the emergency medical personnel know if you think you have  COVID-19. Summary  COVID-19 is a respiratory infection that is caused by a virus. It is also known as coronavirus disease or novel coronavirus. It can cause serious infections, such as pneumonia, acute respiratory distress syndrome, acute respiratory failure, or sepsis.  The virus that causes COVID-19 is contagious. This means that it can spread from person to person through droplets from breathing, speaking, singing, coughing, or sneezing.  You are more likely to develop a serious illness if you are 50 years of age or older, have a weak immune system, live in a nursing home, or have chronic disease.  There is no medicine to treat COVID-19. Your health care provider will talk with you about ways to treat your symptoms.    Take steps to protect yourself and others from infection. Wash your hands often and disinfect objects and surfaces that are frequently touched every day. Stay away from people who are sick and wear a mask if you are sick. This information is not intended to replace advice given to you by your health care provider. Make sure you discuss any questions you have with your health care provider. Document Revised: 11/10/2018 Document Reviewed: 02/16/2018 [employer] Patient Education  2020 [employer] What types of side effects do monoclonal antibody drugs cause?  Common side effects  In general, the more common side effects caused by monoclonal antibody drugs include: . Allergic reactions, such as hives or itching . Flu-like signs and symptoms, including chills, fatigue, fever, and muscle aches and pains . Nausea, vomiting . Diarrhea . Skin rashes . Low blood pressure   The CDC is recommending patients who receive monoclonal antibody treatments wait at least 90 days before being vaccinated.  Currently, there are no data on the safety and efficacy of mRNA COVID-19 vaccines in persons who received monoclonal antibodies or convalescent plasma as part of COVID-19 treatment. Based  on the estimated half-life of such therapies as well as evidence suggesting that reinfection is uncommon in the 90 days after initial infection, vaccination should be deferred for at least 90 days, as a precautionary measure until additional information becomes available, to avoid interference of the antibody treatment with vaccine-induced immune responses. 

## 2019-10-12 NOTE — Progress Notes (Signed)
  Diagnosis: COVID-19  Physician: Patrick Wright, MD  Procedure: [facility] Med: casirivimab\imdevimab infusion - Provided patient with casirivimab\imdevimab fact sheet for patients, parents and caregivers prior to infusion.  Complications: No immediate complications noted.  Discharge: Discharged home   Barbara Sandoval Mellissa Conley 10/12/2019  

## 2019-12-04 DIAGNOSIS — N911 Secondary amenorrhea: Secondary | ICD-10-CM | POA: Diagnosis not present

## 2019-12-31 DIAGNOSIS — Z114 Encounter for screening for human immunodeficiency virus [HIV]: Secondary | ICD-10-CM | POA: Diagnosis not present

## 2019-12-31 DIAGNOSIS — E079 Disorder of thyroid, unspecified: Secondary | ICD-10-CM | POA: Diagnosis not present

## 2019-12-31 DIAGNOSIS — Z77011 Contact with and (suspected) exposure to lead: Secondary | ICD-10-CM | POA: Diagnosis not present

## 2019-12-31 DIAGNOSIS — Z3481 Encounter for supervision of other normal pregnancy, first trimester: Secondary | ICD-10-CM | POA: Diagnosis not present

## 2019-12-31 DIAGNOSIS — O99211 Obesity complicating pregnancy, first trimester: Secondary | ICD-10-CM | POA: Diagnosis not present

## 2020-01-07 DIAGNOSIS — Z36 Encounter for antenatal screening for chromosomal anomalies: Secondary | ICD-10-CM | POA: Diagnosis not present

## 2020-01-07 DIAGNOSIS — Z3682 Encounter for antenatal screening for nuchal translucency: Secondary | ICD-10-CM | POA: Diagnosis not present

## 2020-01-07 DIAGNOSIS — Z3481 Encounter for supervision of other normal pregnancy, first trimester: Secondary | ICD-10-CM | POA: Diagnosis not present

## 2020-01-08 DIAGNOSIS — Z01419 Encounter for gynecological examination (general) (routine) without abnormal findings: Secondary | ICD-10-CM | POA: Diagnosis not present

## 2020-01-08 DIAGNOSIS — Z113 Encounter for screening for infections with a predominantly sexual mode of transmission: Secondary | ICD-10-CM | POA: Diagnosis not present

## 2020-01-08 DIAGNOSIS — Z124 Encounter for screening for malignant neoplasm of cervix: Secondary | ICD-10-CM | POA: Diagnosis not present

## 2020-02-04 DIAGNOSIS — Z363 Encounter for antenatal screening for malformations: Secondary | ICD-10-CM | POA: Diagnosis not present

## 2020-02-25 DIAGNOSIS — Z363 Encounter for antenatal screening for malformations: Secondary | ICD-10-CM | POA: Diagnosis not present

## 2020-02-25 DIAGNOSIS — O99212 Obesity complicating pregnancy, second trimester: Secondary | ICD-10-CM | POA: Diagnosis not present

## 2020-03-03 DIAGNOSIS — U071 COVID-19: Secondary | ICD-10-CM | POA: Diagnosis not present

## 2020-03-03 DIAGNOSIS — Z331 Pregnant state, incidental: Secondary | ICD-10-CM | POA: Diagnosis not present

## 2020-03-03 DIAGNOSIS — R0600 Dyspnea, unspecified: Secondary | ICD-10-CM | POA: Diagnosis not present

## 2020-03-06 DIAGNOSIS — U071 COVID-19: Secondary | ICD-10-CM | POA: Diagnosis not present

## 2020-03-06 DIAGNOSIS — M940 Chondrocostal junction syndrome [Tietze]: Secondary | ICD-10-CM | POA: Diagnosis not present

## 2020-03-24 DIAGNOSIS — O358XX1 Maternal care for other (suspected) fetal abnormality and damage, fetus 1: Secondary | ICD-10-CM | POA: Diagnosis not present

## 2020-03-24 DIAGNOSIS — O99212 Obesity complicating pregnancy, second trimester: Secondary | ICD-10-CM | POA: Diagnosis not present

## 2020-04-17 DIAGNOSIS — Z3402 Encounter for supervision of normal first pregnancy, second trimester: Secondary | ICD-10-CM | POA: Diagnosis not present

## 2020-04-17 DIAGNOSIS — Z3483 Encounter for supervision of other normal pregnancy, third trimester: Secondary | ICD-10-CM | POA: Diagnosis not present

## 2020-05-01 DIAGNOSIS — O99810 Abnormal glucose complicating pregnancy: Secondary | ICD-10-CM | POA: Diagnosis not present

## 2020-05-15 DIAGNOSIS — Z23 Encounter for immunization: Secondary | ICD-10-CM | POA: Diagnosis not present

## 2020-06-10 DIAGNOSIS — O99213 Obesity complicating pregnancy, third trimester: Secondary | ICD-10-CM | POA: Diagnosis not present

## 2020-06-10 DIAGNOSIS — Z364 Encounter for antenatal screening for fetal growth retardation: Secondary | ICD-10-CM | POA: Diagnosis not present

## 2020-06-10 DIAGNOSIS — O98513 Other viral diseases complicating pregnancy, third trimester: Secondary | ICD-10-CM | POA: Diagnosis not present

## 2020-06-27 DIAGNOSIS — Z3685 Encounter for antenatal screening for Streptococcus B: Secondary | ICD-10-CM | POA: Diagnosis not present

## 2020-06-27 DIAGNOSIS — Z3483 Encounter for supervision of other normal pregnancy, third trimester: Secondary | ICD-10-CM | POA: Diagnosis not present

## 2020-06-27 DIAGNOSIS — Z113 Encounter for screening for infections with a predominantly sexual mode of transmission: Secondary | ICD-10-CM | POA: Diagnosis not present

## 2020-07-14 DIAGNOSIS — Z3A39 39 weeks gestation of pregnancy: Secondary | ICD-10-CM | POA: Diagnosis not present

## 2020-08-25 DIAGNOSIS — Z6831 Body mass index (BMI) 31.0-31.9, adult: Secondary | ICD-10-CM | POA: Diagnosis not present

## 2020-12-09 DIAGNOSIS — B379 Candidiasis, unspecified: Secondary | ICD-10-CM | POA: Diagnosis not present

## 2020-12-09 DIAGNOSIS — T3695XA Adverse effect of unspecified systemic antibiotic, initial encounter: Secondary | ICD-10-CM | POA: Diagnosis not present

## 2020-12-09 DIAGNOSIS — H66002 Acute suppurative otitis media without spontaneous rupture of ear drum, left ear: Secondary | ICD-10-CM | POA: Diagnosis not present

## 2020-12-09 DIAGNOSIS — J029 Acute pharyngitis, unspecified: Secondary | ICD-10-CM | POA: Diagnosis not present

## 2021-03-14 DIAGNOSIS — J029 Acute pharyngitis, unspecified: Secondary | ICD-10-CM | POA: Diagnosis not present

## 2021-03-18 DIAGNOSIS — H10029 Other mucopurulent conjunctivitis, unspecified eye: Secondary | ICD-10-CM | POA: Diagnosis not present

## 2021-03-18 DIAGNOSIS — J029 Acute pharyngitis, unspecified: Secondary | ICD-10-CM | POA: Diagnosis not present

## 2021-08-20 DIAGNOSIS — J209 Acute bronchitis, unspecified: Secondary | ICD-10-CM | POA: Diagnosis not present

## 2021-08-20 DIAGNOSIS — Z6833 Body mass index (BMI) 33.0-33.9, adult: Secondary | ICD-10-CM | POA: Diagnosis not present

## 2021-08-20 DIAGNOSIS — J018 Other acute sinusitis: Secondary | ICD-10-CM | POA: Diagnosis not present

## 2021-08-20 DIAGNOSIS — B3731 Acute candidiasis of vulva and vagina: Secondary | ICD-10-CM | POA: Diagnosis not present

## 2021-09-11 DIAGNOSIS — H66002 Acute suppurative otitis media without spontaneous rupture of ear drum, left ear: Secondary | ICD-10-CM | POA: Diagnosis not present

## 2021-09-11 DIAGNOSIS — Z792 Long term (current) use of antibiotics: Secondary | ICD-10-CM | POA: Diagnosis not present

## 2021-09-11 DIAGNOSIS — Z6833 Body mass index (BMI) 33.0-33.9, adult: Secondary | ICD-10-CM | POA: Diagnosis not present

## 2021-09-14 DIAGNOSIS — H60509 Unspecified acute noninfective otitis externa, unspecified ear: Secondary | ICD-10-CM | POA: Diagnosis not present

## 2021-09-14 DIAGNOSIS — H6690 Otitis media, unspecified, unspecified ear: Secondary | ICD-10-CM | POA: Diagnosis not present

## 2021-12-01 DIAGNOSIS — Z3202 Encounter for pregnancy test, result negative: Secondary | ICD-10-CM | POA: Diagnosis not present

## 2021-12-01 DIAGNOSIS — Z01419 Encounter for gynecological examination (general) (routine) without abnormal findings: Secondary | ICD-10-CM | POA: Diagnosis not present

## 2021-12-01 DIAGNOSIS — Z6835 Body mass index (BMI) 35.0-35.9, adult: Secondary | ICD-10-CM | POA: Diagnosis not present

## 2021-12-01 DIAGNOSIS — N938 Other specified abnormal uterine and vaginal bleeding: Secondary | ICD-10-CM | POA: Diagnosis not present

## 2022-01-11 DIAGNOSIS — R509 Fever, unspecified: Secondary | ICD-10-CM | POA: Diagnosis not present

## 2022-01-11 DIAGNOSIS — J069 Acute upper respiratory infection, unspecified: Secondary | ICD-10-CM | POA: Diagnosis not present

## 2022-01-11 DIAGNOSIS — M791 Myalgia, unspecified site: Secondary | ICD-10-CM | POA: Diagnosis not present

## 2022-01-11 DIAGNOSIS — R051 Acute cough: Secondary | ICD-10-CM | POA: Diagnosis not present

## 2022-01-11 DIAGNOSIS — R519 Headache, unspecified: Secondary | ICD-10-CM | POA: Diagnosis not present

## 2022-06-16 DIAGNOSIS — Z6833 Body mass index (BMI) 33.0-33.9, adult: Secondary | ICD-10-CM | POA: Diagnosis not present

## 2022-06-16 DIAGNOSIS — J029 Acute pharyngitis, unspecified: Secondary | ICD-10-CM | POA: Diagnosis not present

## 2022-06-16 DIAGNOSIS — B3731 Acute candidiasis of vulva and vagina: Secondary | ICD-10-CM | POA: Diagnosis not present

## 2022-06-16 DIAGNOSIS — H66002 Acute suppurative otitis media without spontaneous rupture of ear drum, left ear: Secondary | ICD-10-CM | POA: Diagnosis not present

## 2022-06-29 DIAGNOSIS — R5383 Other fatigue: Secondary | ICD-10-CM | POA: Diagnosis not present

## 2022-06-29 DIAGNOSIS — Z13220 Encounter for screening for lipoid disorders: Secondary | ICD-10-CM | POA: Diagnosis not present

## 2022-06-29 DIAGNOSIS — Z0000 Encounter for general adult medical examination without abnormal findings: Secondary | ICD-10-CM | POA: Diagnosis not present

## 2022-11-09 DIAGNOSIS — F902 Attention-deficit hyperactivity disorder, combined type: Secondary | ICD-10-CM | POA: Diagnosis not present

## 2022-11-09 DIAGNOSIS — F419 Anxiety disorder, unspecified: Secondary | ICD-10-CM | POA: Diagnosis not present

## 2022-12-08 DIAGNOSIS — F900 Attention-deficit hyperactivity disorder, predominantly inattentive type: Secondary | ICD-10-CM | POA: Diagnosis not present

## 2022-12-08 DIAGNOSIS — F419 Anxiety disorder, unspecified: Secondary | ICD-10-CM | POA: Diagnosis not present

## 2023-03-10 DIAGNOSIS — J208 Acute bronchitis due to other specified organisms: Secondary | ICD-10-CM | POA: Diagnosis not present

## 2023-03-10 DIAGNOSIS — F419 Anxiety disorder, unspecified: Secondary | ICD-10-CM | POA: Diagnosis not present

## 2023-03-10 DIAGNOSIS — F900 Attention-deficit hyperactivity disorder, predominantly inattentive type: Secondary | ICD-10-CM | POA: Diagnosis not present

## 2023-04-13 DIAGNOSIS — H6992 Unspecified Eustachian tube disorder, left ear: Secondary | ICD-10-CM | POA: Diagnosis not present

## 2023-06-15 DIAGNOSIS — Z822 Family history of deafness and hearing loss: Secondary | ICD-10-CM | POA: Diagnosis not present

## 2023-06-15 DIAGNOSIS — Z0110 Encounter for examination of ears and hearing without abnormal findings: Secondary | ICD-10-CM | POA: Diagnosis not present

## 2023-06-15 DIAGNOSIS — H6992 Unspecified Eustachian tube disorder, left ear: Secondary | ICD-10-CM | POA: Diagnosis not present

## 2023-09-07 DIAGNOSIS — Z6835 Body mass index (BMI) 35.0-35.9, adult: Secondary | ICD-10-CM | POA: Diagnosis not present

## 2023-09-07 DIAGNOSIS — F418 Other specified anxiety disorders: Secondary | ICD-10-CM | POA: Diagnosis not present

## 2023-09-07 DIAGNOSIS — Z01419 Encounter for gynecological examination (general) (routine) without abnormal findings: Secondary | ICD-10-CM | POA: Diagnosis not present

## 2023-09-07 DIAGNOSIS — Z01411 Encounter for gynecological examination (general) (routine) with abnormal findings: Secondary | ICD-10-CM | POA: Diagnosis not present

## 2024-01-06 DIAGNOSIS — B078 Other viral warts: Secondary | ICD-10-CM | POA: Diagnosis not present

## 2024-01-06 DIAGNOSIS — L918 Other hypertrophic disorders of the skin: Secondary | ICD-10-CM | POA: Diagnosis not present

## 2024-01-06 DIAGNOSIS — D224 Melanocytic nevi of scalp and neck: Secondary | ICD-10-CM | POA: Diagnosis not present

## 2024-01-06 DIAGNOSIS — D225 Melanocytic nevi of trunk: Secondary | ICD-10-CM | POA: Diagnosis not present
